# Patient Record
Sex: Male | Born: 2000 | Race: White | Hispanic: No | Marital: Single | Smoking: Never smoker
Health system: Southern US, Community
[De-identification: ages and names within clinical notes are randomized; demographics above are authoritative.]

## PROBLEM LIST (undated history)

## (undated) DIAGNOSIS — K219 Gastro-esophageal reflux disease without esophagitis: Secondary | ICD-10-CM

## (undated) DIAGNOSIS — IMO0001 Reserved for inherently not codable concepts without codable children: Secondary | ICD-10-CM

## (undated) HISTORY — PX: APPENDECTOMY: SHX54

---
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
## 2018-03-04 IMAGING — DX DG TIBIA/FIBULA 2V*R*
4 series · 4 of 4 positions shown · non-contrast
Comparison: None.

CLINICAL DATA: Acute onset of right ankle pain and swelling after
football injury. Initial encounter.

EXAM:
RIGHT TIBIA AND FIBULA - 2 VIEW

[tibia ap (1 of 2)]
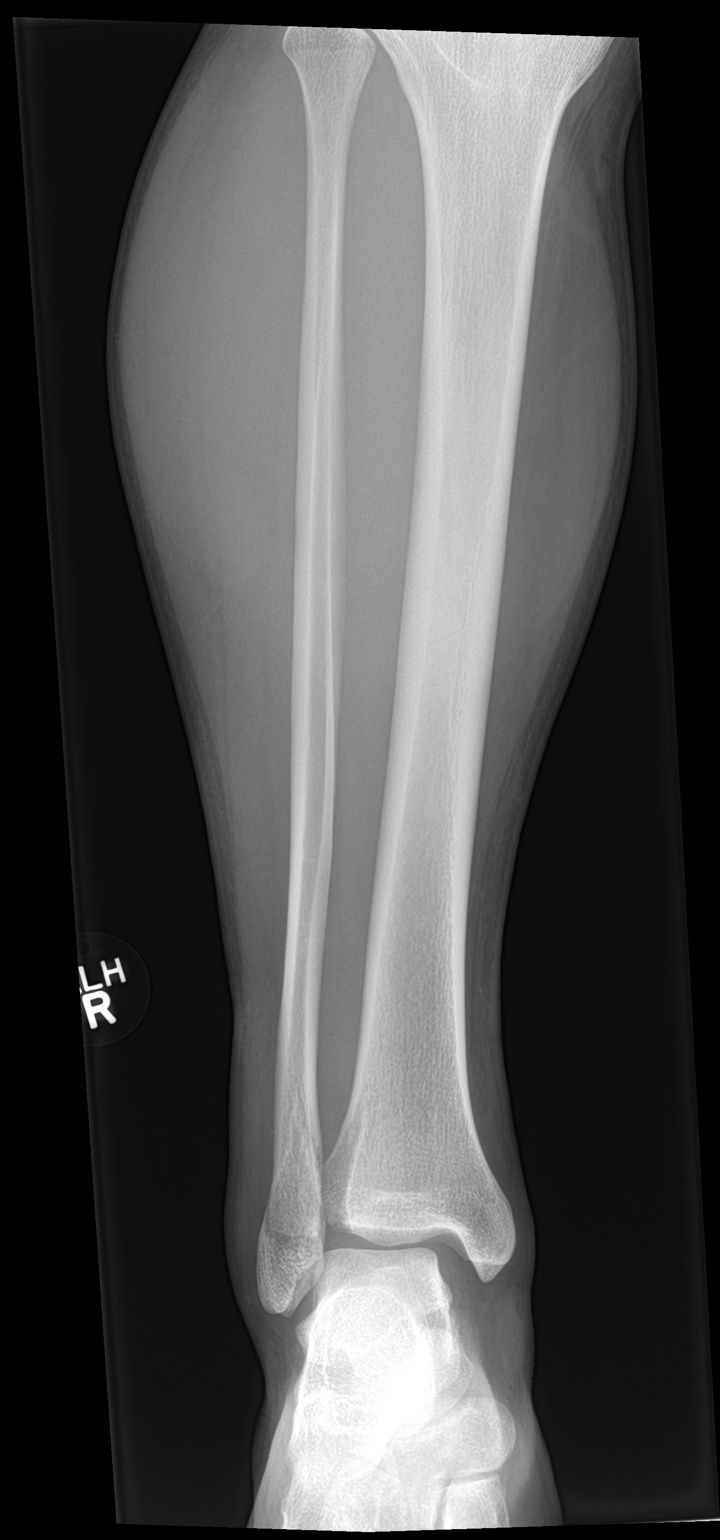

[tibia ap (2 of 2)]
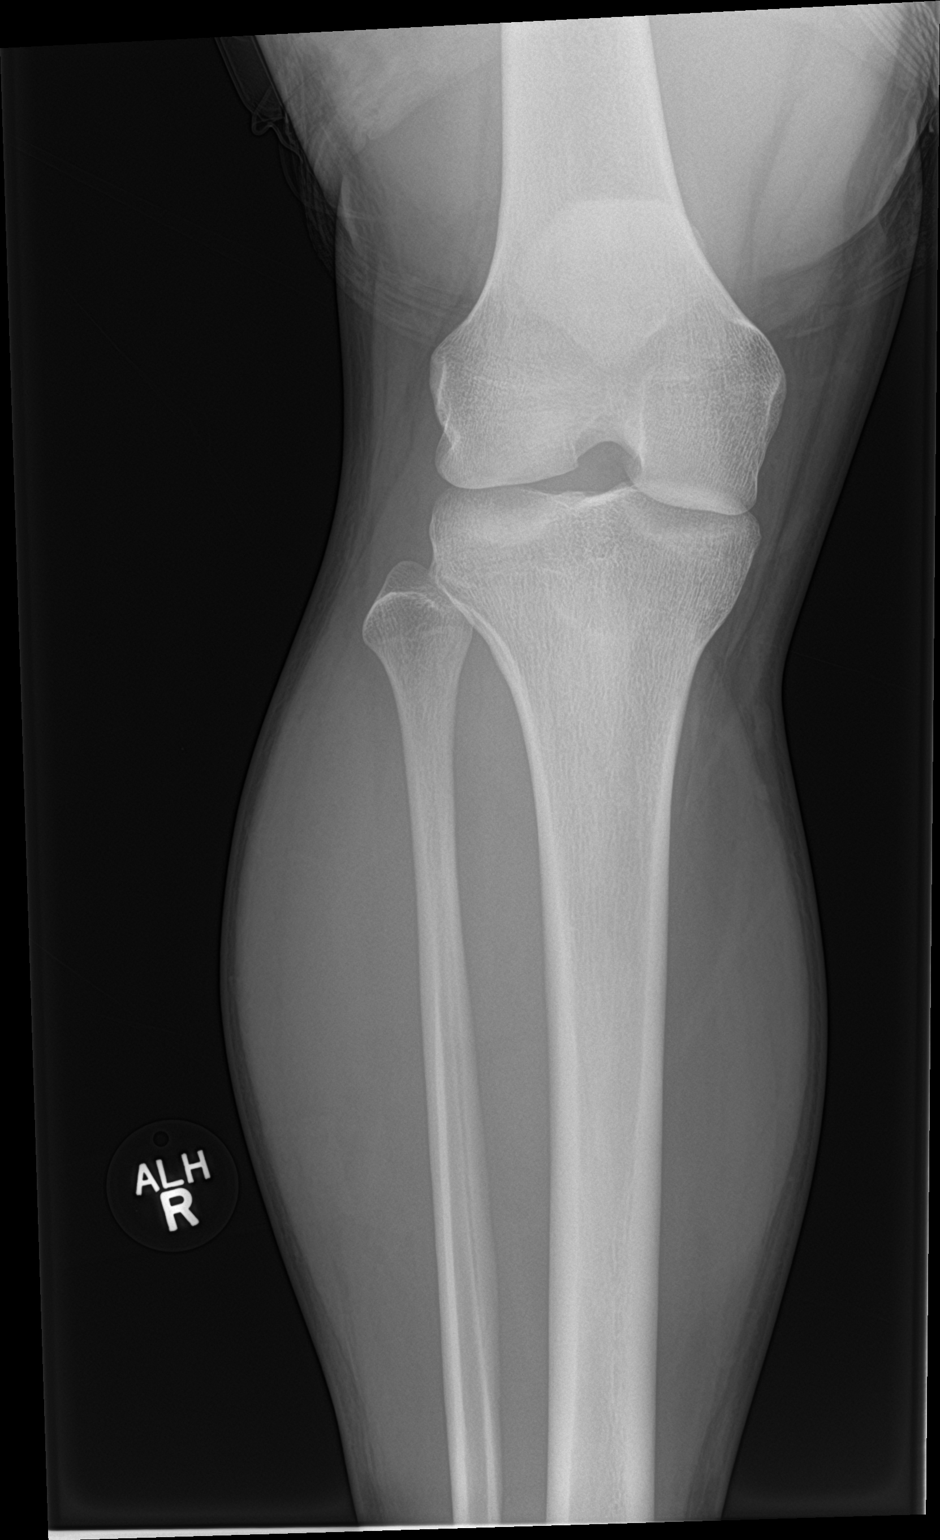

[tibia lat (1 of 2)]
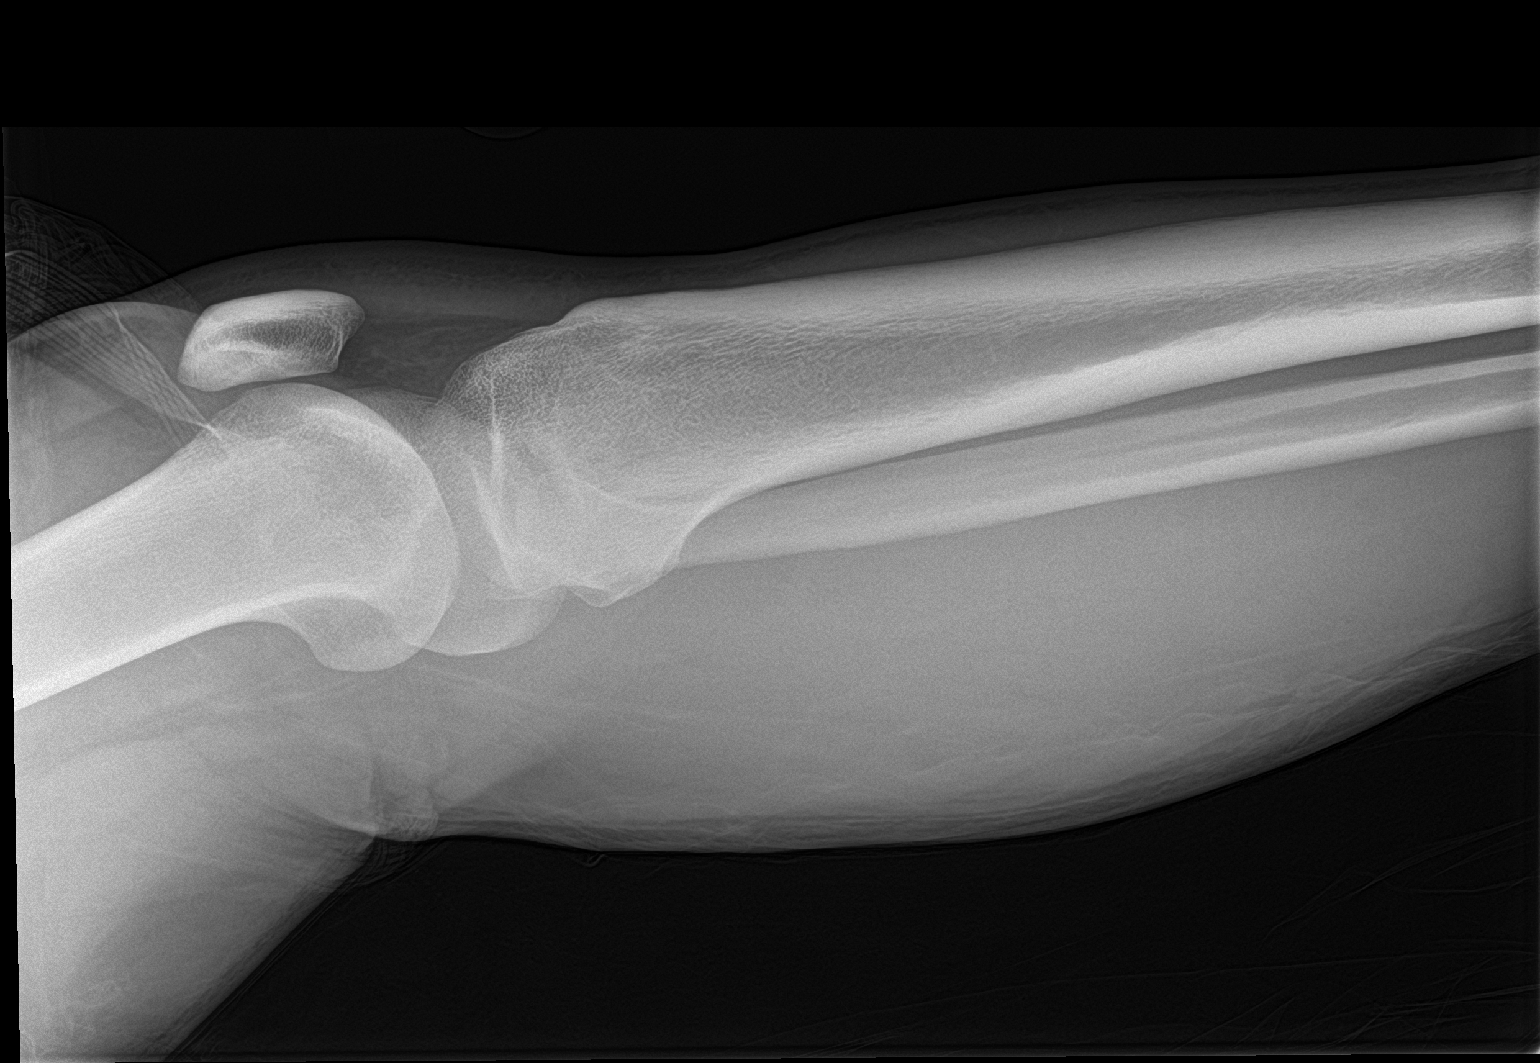

[tibia lat (2 of 2)]
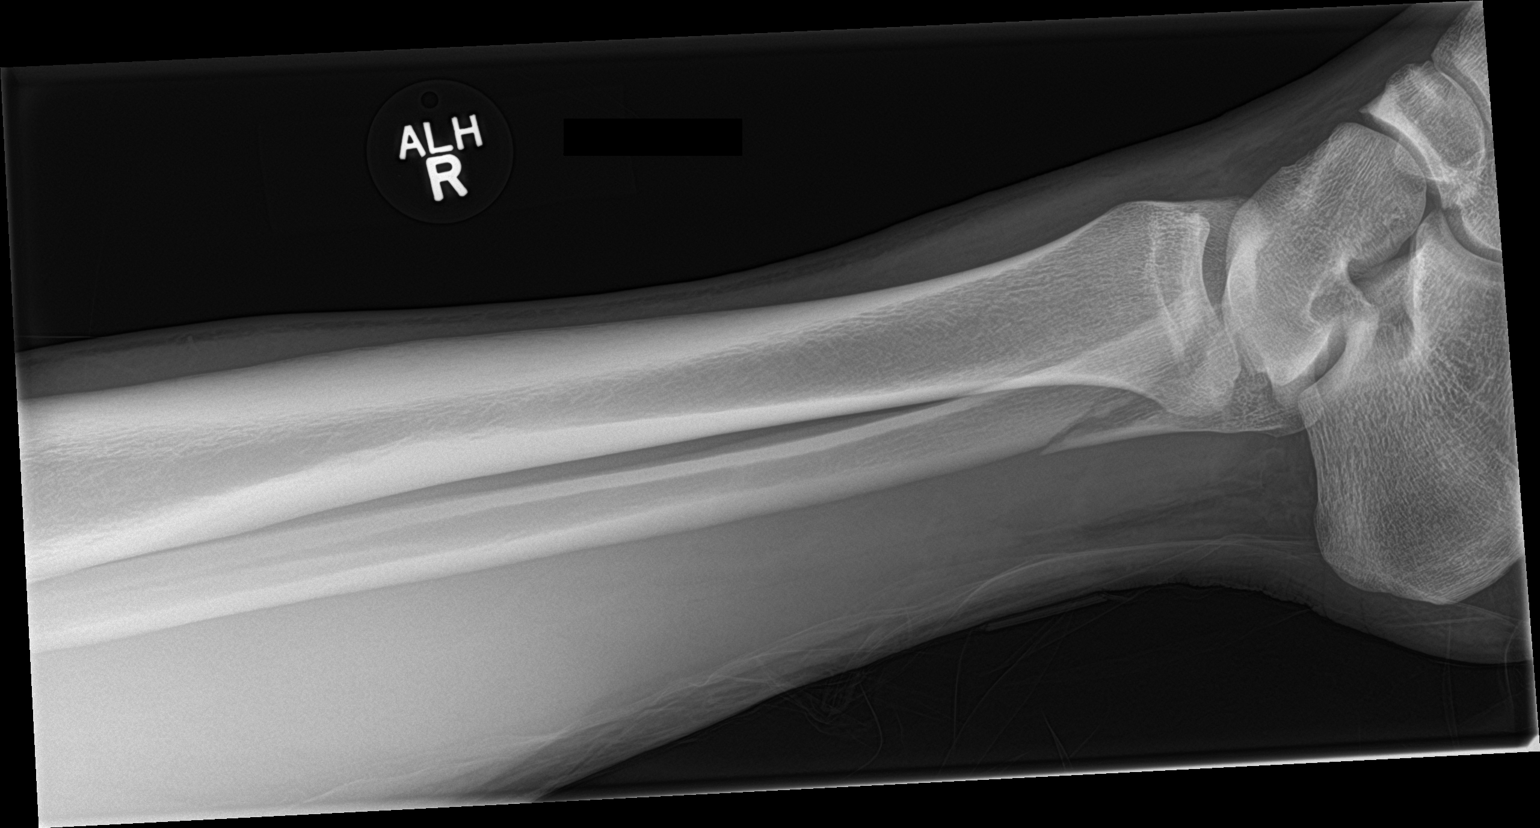

[4 of 4 positions shown; findings below may reference images not displayed]

FINDINGS: There is a mildly displaced fracture through the distal fibula. No
additional fractures are seen. No knee joint effusion is identified.
The tibia appears intact. Mild soft tissue swelling is noted about
the distal fibula.
IMPRESSION: Mildly displaced fracture through the distal fibula.

## 2018-03-04 IMAGING — DX DG ANKLE COMPLETE 3+V*R*
3 series · 3 of 3 positions shown · non-contrast
Comparison: None.

CLINICAL DATA: Acute onset of right ankle pain and swelling after
football injury. Initial encounter.

EXAM:
RIGHT ANKLE - COMPLETE 3+ VIEW

[ankle ap]
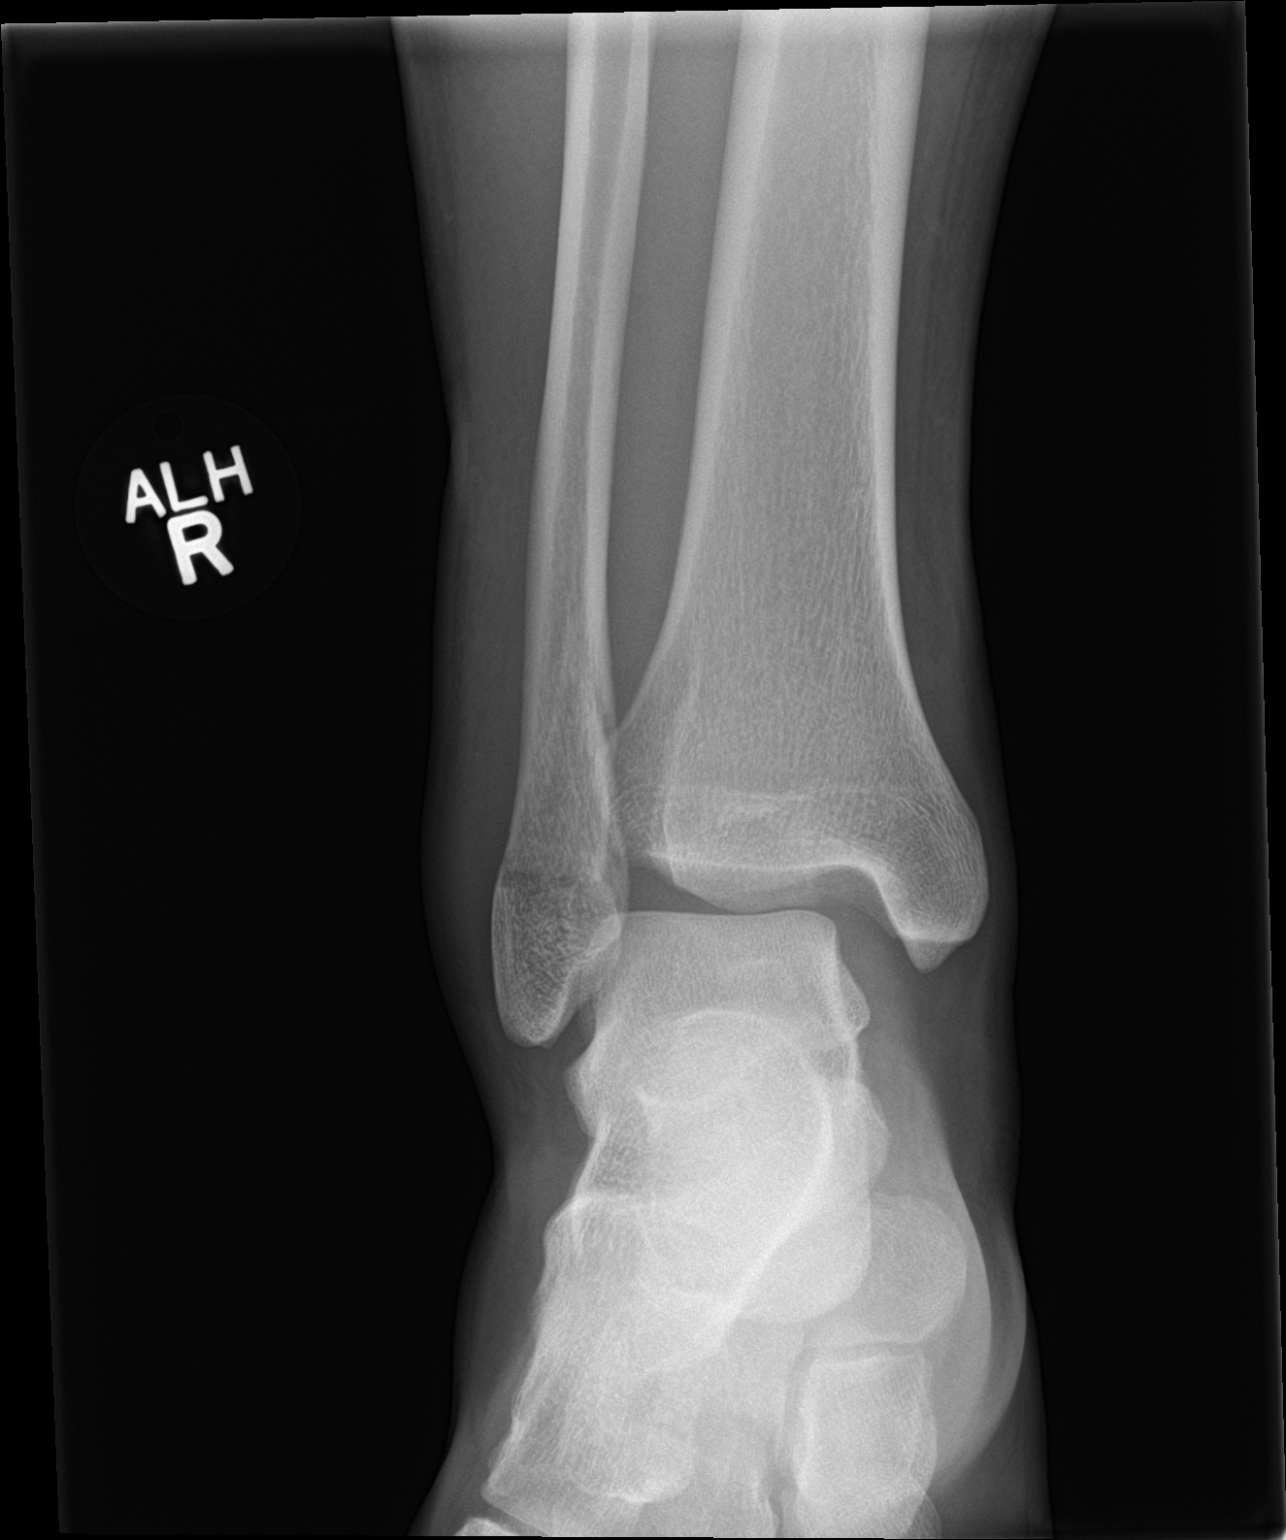

[ankle obl]
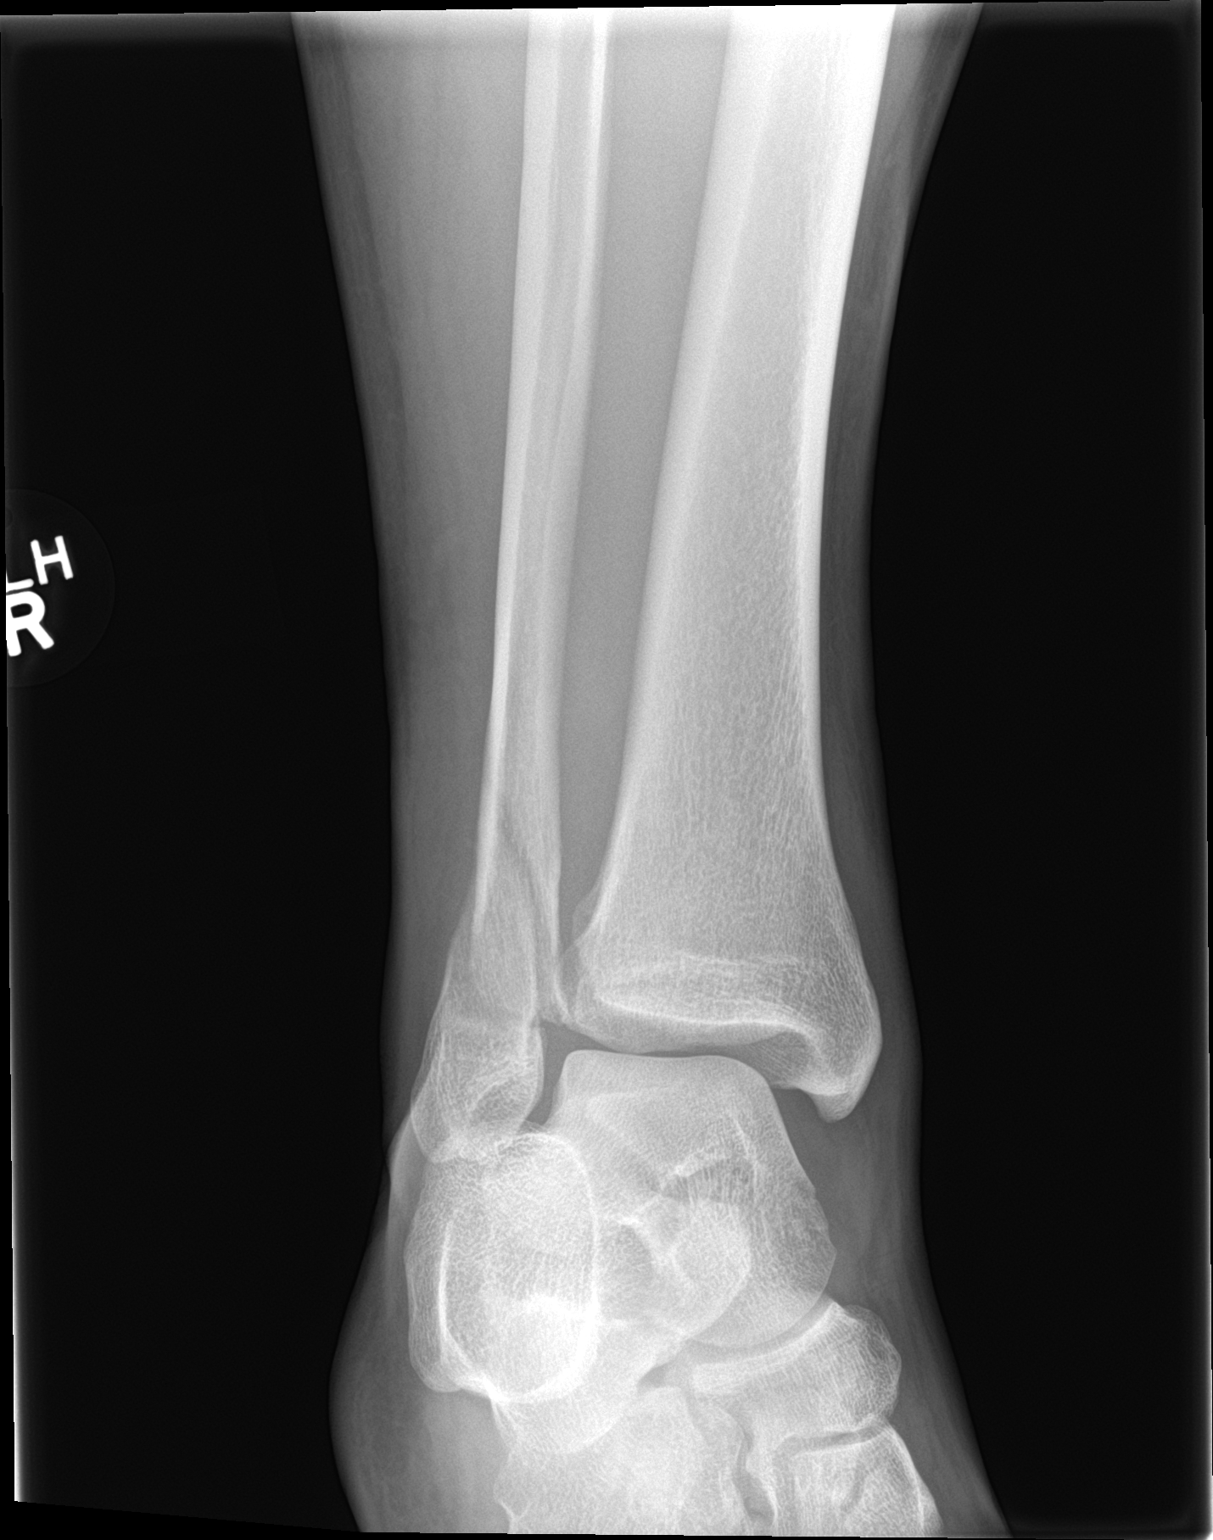

[ankle lat]
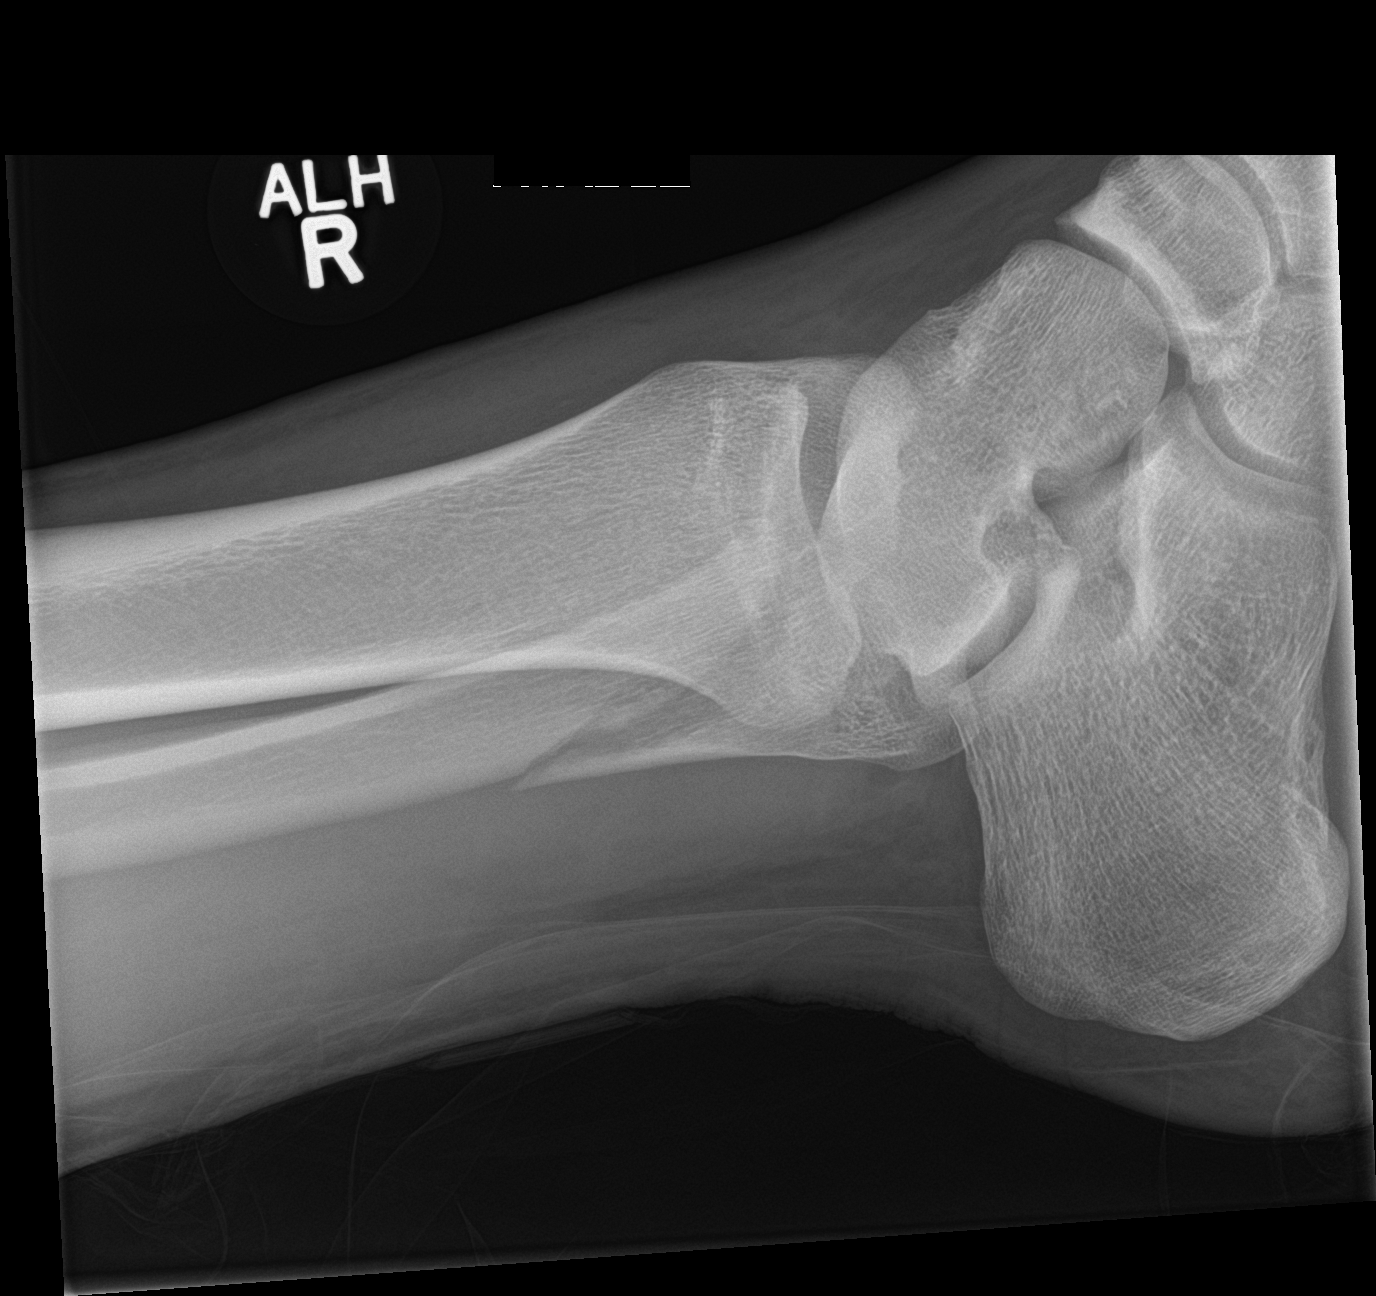

[3 of 3 positions shown; findings below may reference images not displayed]

FINDINGS: There is an oblique mildly displaced fracture through the distal
fibula, with posterior and lateral displacement. There is associated
medial widening of the ankle mortise. The interosseous space is
grossly preserved. Soft tissue swelling is noted overlying the
distal fibula.
IMPRESSION: Oblique mildly displaced fracture through the distal fibula, with
posterior and lateral displacement. Associated medial widening of
the ankle mortise.
